# Patient Record
Sex: Female | Born: 1975 | Race: White | Hispanic: No | Marital: Single | Smoking: Never smoker
Health system: Southern US, Community
[De-identification: ages and names within clinical notes are randomized; demographics above are authoritative.]

## PROBLEM LIST (undated history)

## (undated) ENCOUNTER — Emergency Department: Disposition: A | Discharge status: Home / Self Care | Payer: BC Managed Care – PPO

---

## 2007-02-26 ENCOUNTER — Ambulatory Visit: Payer: Self-pay | Admitting: Internal Medicine

## 2007-03-01 ENCOUNTER — Ambulatory Visit: Payer: Self-pay | Admitting: Internal Medicine

## 2008-06-23 ENCOUNTER — Ambulatory Visit: Payer: Self-pay | Admitting: Family Medicine

## 2008-12-10 ENCOUNTER — Ambulatory Visit: Payer: Self-pay | Admitting: Internal Medicine

## 2015-12-12 ENCOUNTER — Ambulatory Visit
Admission: EM | Admit: 2015-12-12 | Discharge: 2015-12-12 | Disposition: A | Discharge status: Home / Self Care | Payer: BLUE CROSS/BLUE SHIELD | Attending: Family Medicine | Admitting: Family Medicine

## 2015-12-12 ENCOUNTER — Encounter: Payer: Self-pay | Admitting: *Deleted

## 2015-12-12 DIAGNOSIS — J208 Acute bronchitis due to other specified organisms: Secondary | ICD-10-CM

## 2015-12-12 MED ORDER — GUAIFENESIN-CODEINE 100-10 MG/5ML PO SOLN: ORAL | Status: AC

## 2015-12-12 NOTE — ED Notes (Signed)
Non-productive cough, and sore throat since Saturday. [facility] and dx with bronchitis and rx Z-pak.

## 2015-12-12 NOTE — ED Provider Notes (Signed)
CSN: [id number]     Arrival date & time 12/12/15  1028 History   First MD Initiated Contact with Patient 12/12/15 1110     Chief Complaint  Patient presents with  . Sore Throat  . Cough   (Consider location/radiation/quality/duration/timing/severity/associated sxs/prior Treatment) Patient is a 40 y.o. female presenting with URI. The history is provided by the patient.  URI Presenting symptoms: cough, rhinorrhea and sore throat   Presenting symptoms: no ear pain, no facial pain and no fever   Severity:  Moderate Onset quality:  Sudden Duration:  6 days Timing:  Constant Progression:  Unchanged Chronicity:  New Relieved by:  Nothing Ineffective treatments:  OTC medications and prescription medications (z pak) Associated symptoms: no headaches, no neck pain, no sinus pain and no wheezing   Risk factors: not elderly, no chronic cardiac disease, no chronic kidney disease, no chronic respiratory disease, no diabetes mellitus, no immunosuppression, no recent illness, no recent travel and no sick contacts     History reviewed. No pertinent past medical history. History reviewed. No pertinent past surgical history. History reviewed. No pertinent family history. Social History  Substance Use Topics  . Smoking status: Never Smoker   . Smokeless tobacco: None  . Alcohol Use: No   OB History    No data available     Review of Systems  Constitutional: Negative for fever.  HENT: Positive for rhinorrhea and sore throat. Negative for ear pain.   Respiratory: Positive for cough. Negative for wheezing.   Musculoskeletal: Negative for neck pain.  Neurological: Negative for headaches.    Allergies  Sulfa antibiotics  Home Medications   Prior to Admission medications   Medication Sig Start Date End Date Taking? Authorizing Provider  azithromycin (ZITHROMAX) 250 MG tablet Take by mouth daily.   Yes Historical Provider, MD  benzonatate (TESSALON) 100 MG capsule Take 100 mg by mouth 3  (three) times daily as needed for cough.   Yes Historical Provider, MD  guaiFENesin-codeine 100-10 MG/5ML syrup 1-2 teaspoons po qhs prn cough 12/12/15   Payton Mccallumrlando Analissa Bayless, MD   Meds Ordered and Administered this Visit  Medications - No data to display  BP 131/95 mmHg  Pulse 128  Temp(Src) 98 F (36.7 C) (Oral)  Resp 20  Ht 5\' 2"  (1.575 m)  Wt 150 lb (68.04 kg)  BMI 27.43 kg/m2  SpO2 98%  LMP 11/28/2015 (Exact Date) No data found.   Physical Exam  Constitutional: She appears well-developed and well-nourished. No distress.  HENT:  Head: Normocephalic and atraumatic.  Right Ear: Tympanic membrane, external ear and ear canal normal.  Left Ear: Tympanic membrane, external ear and ear canal normal.  Nose: No nose lacerations, sinus tenderness, nasal deformity, septal deviation or nasal septal hematoma. No epistaxis.  No foreign bodies.  Mouth/Throat: Uvula is midline, oropharynx is clear and moist and mucous membranes are normal. No oropharyngeal exudate.  Eyes: Conjunctivae and EOM are normal. Pupils are equal, round, and reactive to light. Right eye exhibits no discharge. Left eye exhibits no discharge. No scleral icterus.  Neck: Normal range of motion. Neck supple. No thyromegaly present.  Cardiovascular: Normal rate, regular rhythm and normal heart sounds.   Pulmonary/Chest: Effort normal and breath sounds normal. No respiratory distress. She has no wheezes. She has no rales.  Lymphadenopathy:    She has no cervical adenopathy.  Skin: She is not diaphoretic.  Nursing note and vitals reviewed.   ED Course  Procedures (including critical care time)  Labs Review Labs  Reviewed - No data to display  Imaging Review No results found.   Visual Acuity Review  Right Eye Distance:   Left Eye Distance:   Bilateral Distance:    Right Eye Near:   Left Eye Near:    Bilateral Near:         MDM   1. Viral bronchitis    Discharge Medication List as of 12/12/2015 11:26 AM     START taking these medications   Details  guaiFENesin-codeine 100-10 MG/5ML syrup 1-2 teaspoons po qhs prn cough, Print       1. diagnosis reviewed with patient 2. rx as per orders above; reviewed possible side effects, interactions, risks and benefits  3. Recommend supportive treatment with fluids 4. Follow-up prn if symptoms worsen or don't improve    Payton Mccallumrlando Holly Pring, MD 12/12/15 1504

## 2015-12-12 NOTE — Discharge Instructions (Signed)

## 2018-02-20 ENCOUNTER — Other Ambulatory Visit: Payer: Self-pay | Admitting: Family Medicine

## 2018-02-20 DIAGNOSIS — Z1231 Encounter for screening mammogram for malignant neoplasm of breast: Secondary | ICD-10-CM

## 2018-02-27 ENCOUNTER — Ambulatory Visit
Admission: RE | Admit: 2018-02-27 | Discharge: 2018-02-27 | Disposition: A | Discharge status: Home / Self Care | Payer: BLUE CROSS/BLUE SHIELD | Source: Ambulatory Visit | Attending: Family Medicine | Admitting: Family Medicine

## 2018-02-27 DIAGNOSIS — Z1231 Encounter for screening mammogram for malignant neoplasm of breast: Secondary | ICD-10-CM | POA: Insufficient documentation

## 2018-02-28 ENCOUNTER — Other Ambulatory Visit: Payer: Self-pay | Admitting: Family Medicine

## 2018-02-28 DIAGNOSIS — N6489 Other specified disorders of breast: Secondary | ICD-10-CM

## 2018-02-28 DIAGNOSIS — R928 Other abnormal and inconclusive findings on diagnostic imaging of breast: Secondary | ICD-10-CM

## 2018-03-10 ENCOUNTER — Ambulatory Visit
Admission: RE | Admit: 2018-03-10 | Discharge: 2018-03-10 | Disposition: A | Discharge status: Home / Self Care | Payer: BLUE CROSS/BLUE SHIELD | Source: Ambulatory Visit | Attending: Family Medicine | Admitting: Family Medicine

## 2018-03-10 DIAGNOSIS — N6489 Other specified disorders of breast: Secondary | ICD-10-CM

## 2018-03-10 DIAGNOSIS — R928 Other abnormal and inconclusive findings on diagnostic imaging of breast: Secondary | ICD-10-CM | POA: Diagnosis not present

## 2019-01-31 ENCOUNTER — Other Ambulatory Visit: Payer: Self-pay | Admitting: Family Medicine

## 2019-01-31 DIAGNOSIS — Z1231 Encounter for screening mammogram for malignant neoplasm of breast: Secondary | ICD-10-CM

## 2019-03-01 ENCOUNTER — Ambulatory Visit
Admission: RE | Admit: 2019-03-01 | Discharge: 2019-03-01 | Disposition: A | Discharge status: Home / Self Care | Payer: BC Managed Care – PPO | Source: Ambulatory Visit | Attending: Family Medicine | Admitting: Family Medicine

## 2019-03-01 ENCOUNTER — Encounter (INDEPENDENT_AMBULATORY_CARE_PROVIDER_SITE_OTHER): Payer: Self-pay

## 2019-03-01 ENCOUNTER — Other Ambulatory Visit: Payer: Self-pay

## 2019-03-01 DIAGNOSIS — Z1231 Encounter for screening mammogram for malignant neoplasm of breast: Secondary | ICD-10-CM | POA: Insufficient documentation

## 2020-01-09 ENCOUNTER — Other Ambulatory Visit: Payer: Self-pay | Admitting: Pediatrics

## 2020-01-09 DIAGNOSIS — Z1231 Encounter for screening mammogram for malignant neoplasm of breast: Secondary | ICD-10-CM

## 2020-01-20 IMAGING — MG MM DIGITAL SCREENING BILAT W/ TOMO W/ CAD
6 of 10 series · 6 of 26 positions shown · non-contrast
Comparison: None.

CLINICAL DATA: Screening.

EXAM:
DIGITAL SCREENING BILATERAL MAMMOGRAM WITH TOMO AND CAD

[R CC synth-2D]
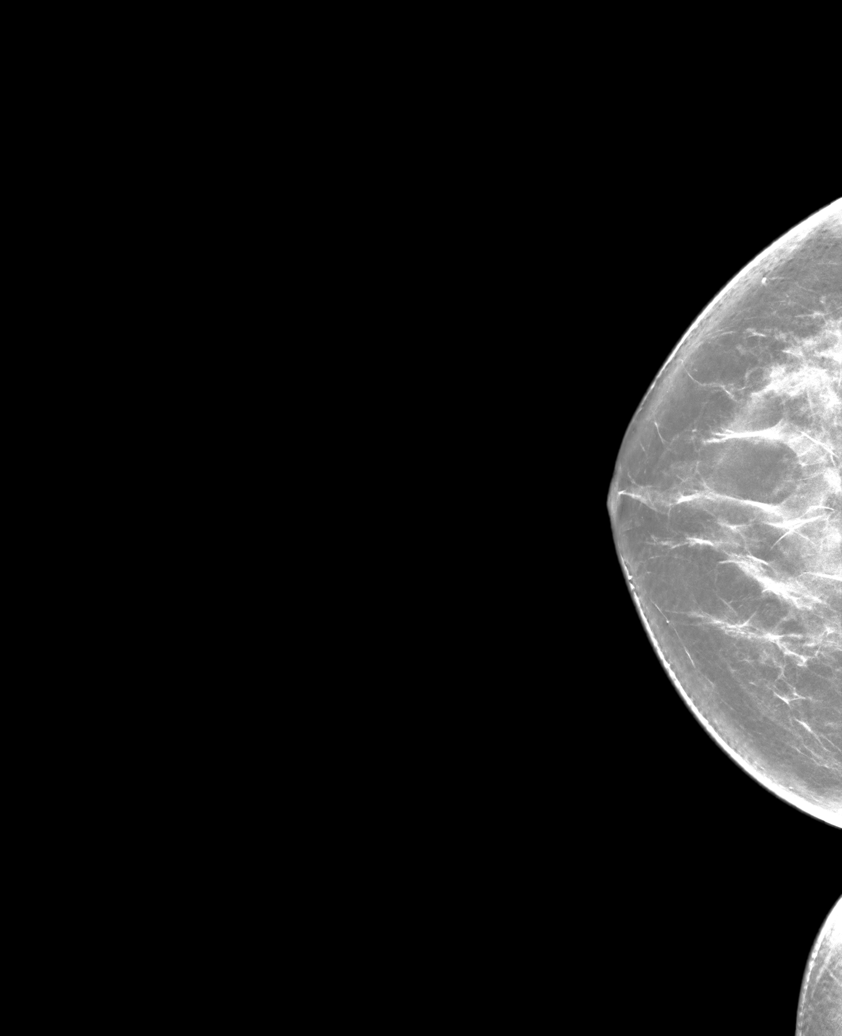

[R MLO synth-2D]
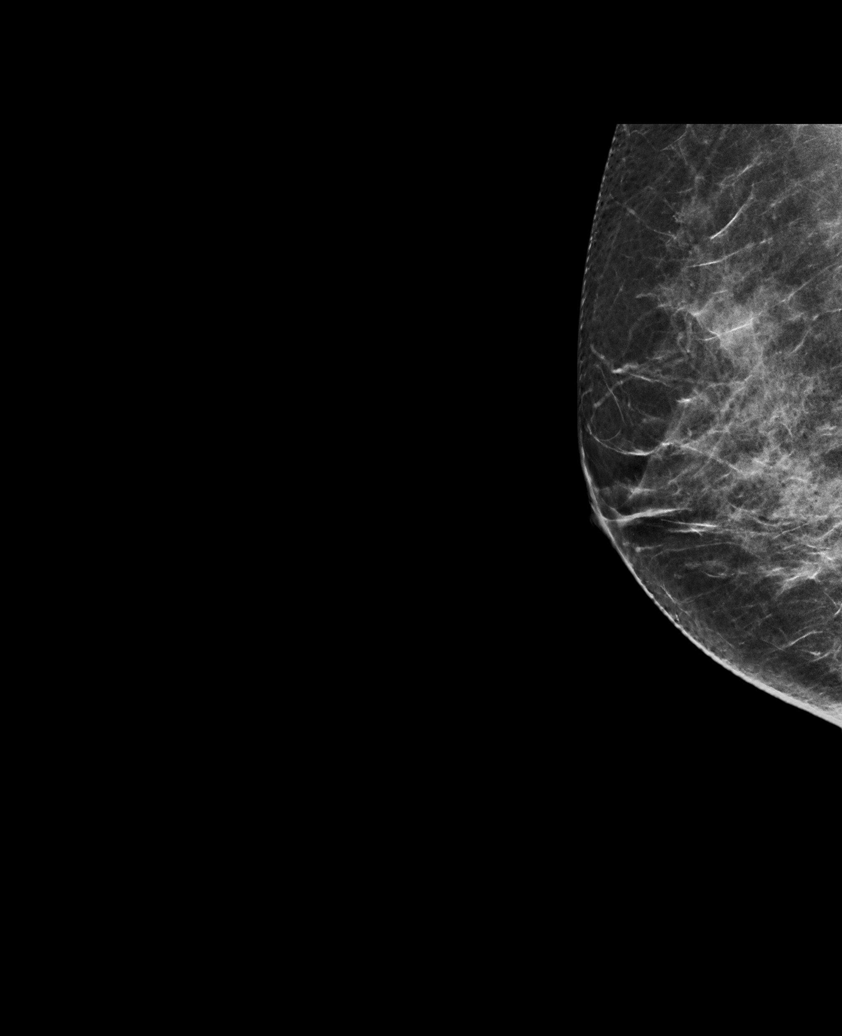

[L MLO synth-2D]
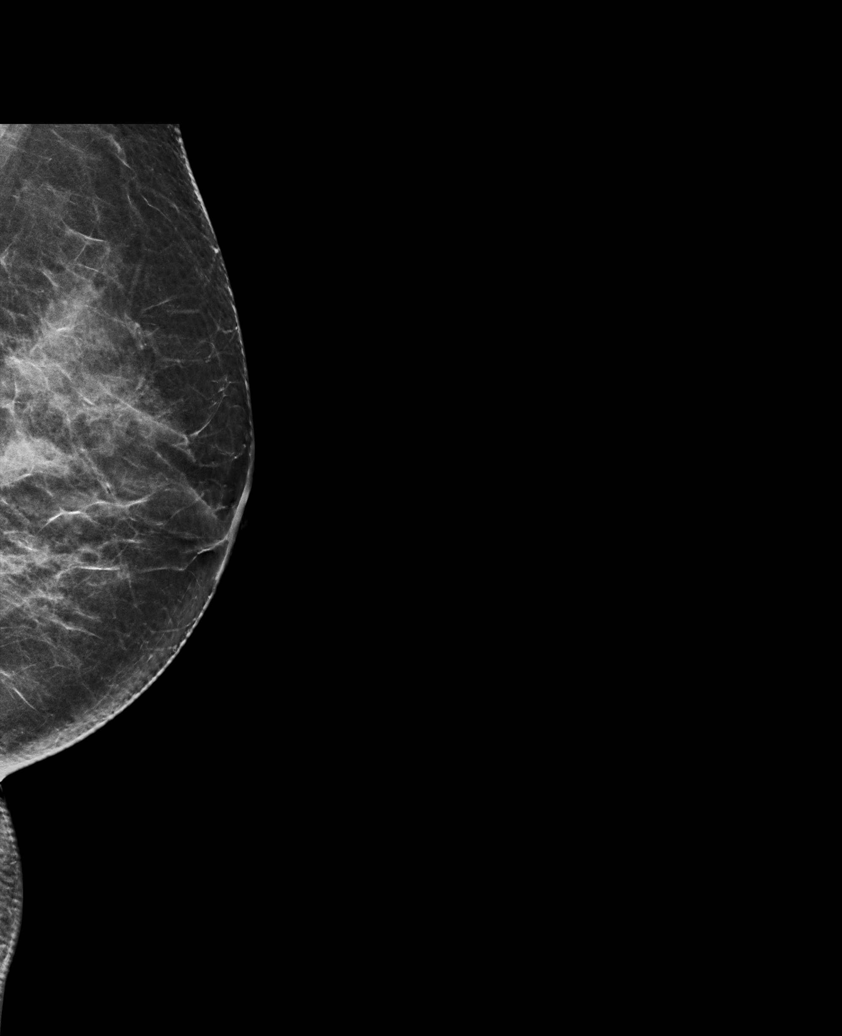

[L CC synth-2D]
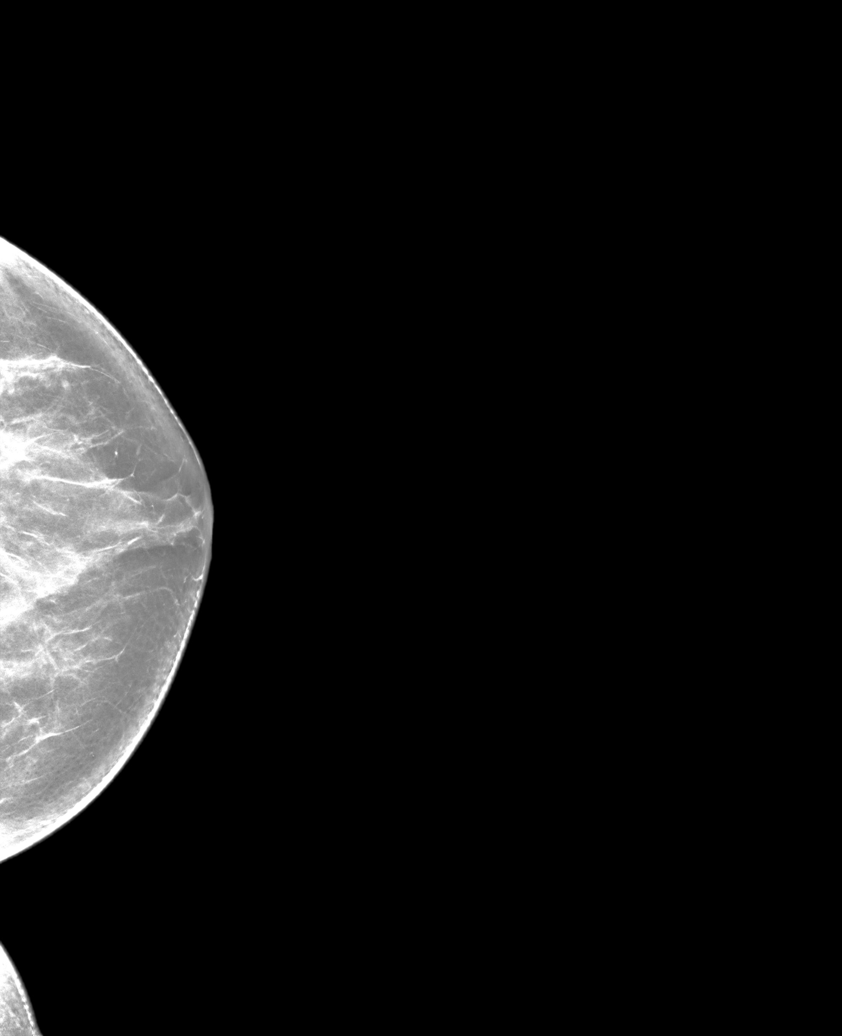

[L MLO (1 of 2)]
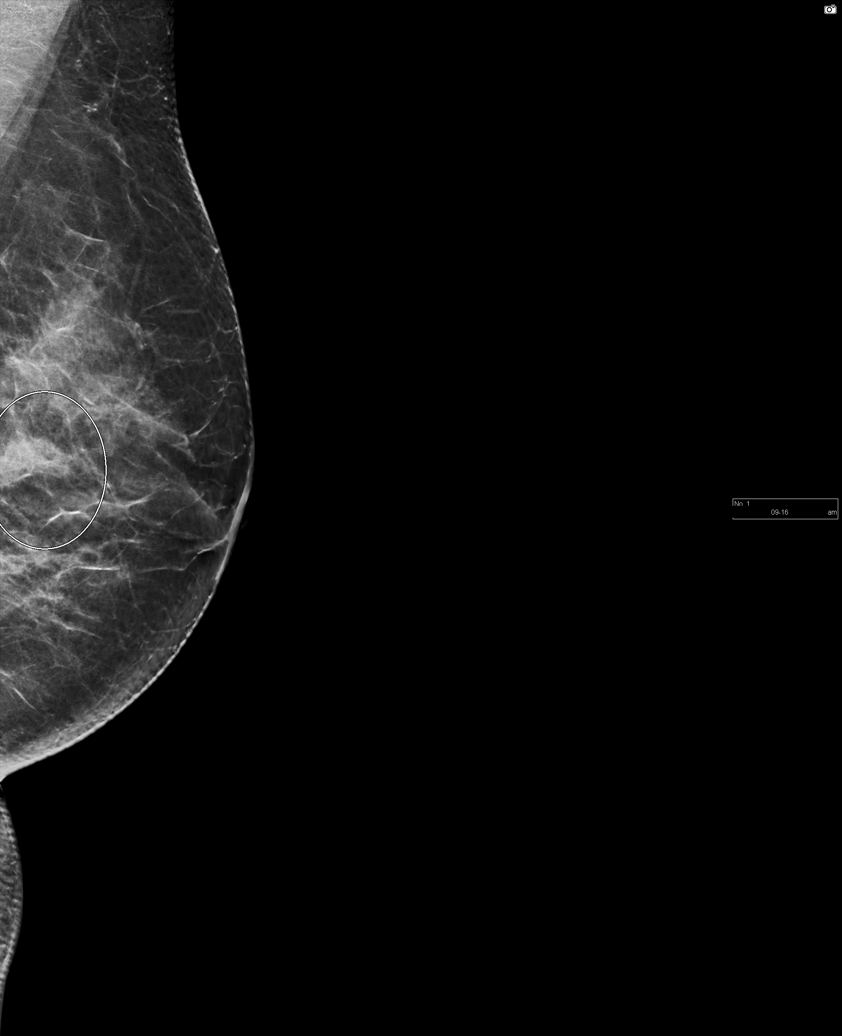

[L MLO (2 of 2)]
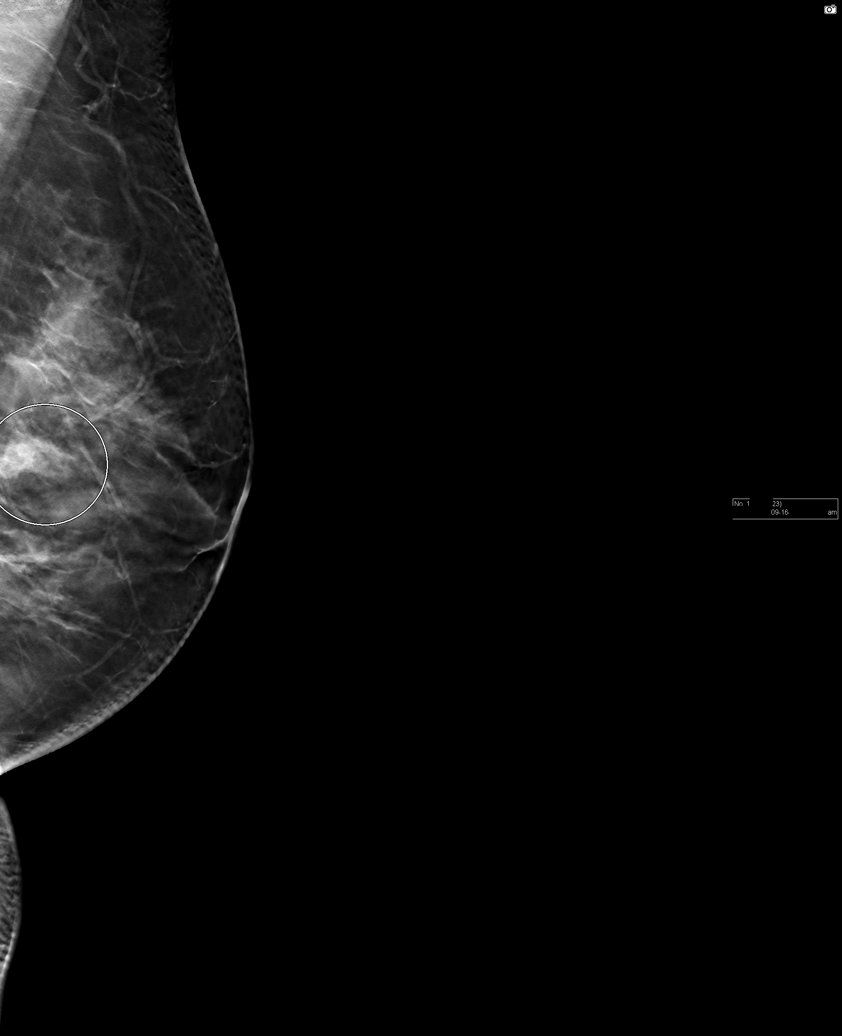

[6 of 26 positions shown; findings below may reference images not displayed]

ACR Breast Density Category c: The breast tissue is heterogeneously
dense, which may obscure small masses.
FINDINGS: In the left breast, a possible asymmetry warrants further
evaluation. In the right breast, no findings suspicious for
malignancy. Images were processed with CAD.
IMPRESSION: Further evaluation is suggested for possible asymmetry in the left
breast.

RECOMMENDATION:
Diagnostic mammogram and possibly ultrasound of the left breast.
(Code:[id number]-[id number])

The patient will be contacted regarding the findings, and additional
imaging will be scheduled.

BI-RADS CATEGORY  0: Incomplete. Need additional imaging evaluation
and/or prior mammograms for comparison.

## 2020-03-05 ENCOUNTER — Other Ambulatory Visit: Payer: Self-pay

## 2020-03-05 ENCOUNTER — Ambulatory Visit
Admission: RE | Admit: 2020-03-05 | Discharge: 2020-03-05 | Disposition: A | Discharge status: Home / Self Care | Payer: BC Managed Care – PPO | Source: Ambulatory Visit | Attending: Pediatrics | Admitting: Pediatrics

## 2020-03-05 DIAGNOSIS — Z1231 Encounter for screening mammogram for malignant neoplasm of breast: Secondary | ICD-10-CM | POA: Insufficient documentation

## 2021-03-09 ENCOUNTER — Other Ambulatory Visit: Payer: Self-pay | Admitting: Family Medicine

## 2021-03-09 DIAGNOSIS — Z1231 Encounter for screening mammogram for malignant neoplasm of breast: Secondary | ICD-10-CM

## 2021-03-26 ENCOUNTER — Ambulatory Visit
Admission: RE | Admit: 2021-03-26 | Discharge: 2021-03-26 | Disposition: A | Discharge status: Home / Self Care | Payer: BC Managed Care – PPO | Source: Ambulatory Visit | Attending: Family Medicine | Admitting: Family Medicine

## 2021-03-26 ENCOUNTER — Other Ambulatory Visit: Payer: Self-pay

## 2021-03-26 DIAGNOSIS — Z1231 Encounter for screening mammogram for malignant neoplasm of breast: Secondary | ICD-10-CM | POA: Insufficient documentation

## 2022-03-19 ENCOUNTER — Other Ambulatory Visit: Payer: Self-pay | Admitting: Physician Assistant

## 2022-03-19 DIAGNOSIS — Z1231 Encounter for screening mammogram for malignant neoplasm of breast: Secondary | ICD-10-CM

## 2022-04-20 ENCOUNTER — Ambulatory Visit
Admission: RE | Admit: 2022-04-20 | Discharge: 2022-04-20 | Disposition: A | Discharge status: Home / Self Care | Payer: BC Managed Care – PPO | Source: Ambulatory Visit | Attending: Physician Assistant | Admitting: Physician Assistant

## 2022-04-20 DIAGNOSIS — Z1231 Encounter for screening mammogram for malignant neoplasm of breast: Secondary | ICD-10-CM | POA: Insufficient documentation

## 2023-04-22 ENCOUNTER — Other Ambulatory Visit: Payer: Self-pay | Admitting: Physician Assistant

## 2023-04-22 DIAGNOSIS — Z1231 Encounter for screening mammogram for malignant neoplasm of breast: Secondary | ICD-10-CM

## 2023-05-19 ENCOUNTER — Ambulatory Visit
Admission: RE | Admit: 2023-05-19 | Discharge: 2023-05-19 | Disposition: A | Discharge status: Home / Self Care | Payer: BC Managed Care – PPO | Source: Ambulatory Visit | Attending: Physician Assistant | Admitting: Physician Assistant

## 2023-05-19 DIAGNOSIS — Z1231 Encounter for screening mammogram for malignant neoplasm of breast: Secondary | ICD-10-CM | POA: Insufficient documentation

## 2023-06-15 ENCOUNTER — Other Ambulatory Visit: Payer: Self-pay | Admitting: Medical Genetics

## 2023-07-16 ENCOUNTER — Other Ambulatory Visit
Admission: RE | Admit: 2023-07-16 | Discharge: 2023-07-16 | Disposition: A | Discharge status: Home / Self Care | Payer: Self-pay | Source: Ambulatory Visit | Attending: Medical Genetics | Admitting: Medical Genetics

## 2023-07-28 LAB — GENECONNECT MOLECULAR SCREEN: Genetic Analysis Overall Interpretation: NEGATIVE

## 2024-05-16 ENCOUNTER — Other Ambulatory Visit: Payer: Self-pay | Admitting: Physician Assistant

## 2024-05-16 DIAGNOSIS — Z1231 Encounter for screening mammogram for malignant neoplasm of breast: Secondary | ICD-10-CM

## 2024-06-25 ENCOUNTER — Ambulatory Visit
Admission: RE | Admit: 2024-06-25 | Discharge: 2024-06-25 | Disposition: A | Discharge status: Home / Self Care | Source: Ambulatory Visit | Attending: Physician Assistant | Admitting: Physician Assistant

## 2024-06-25 DIAGNOSIS — Z1231 Encounter for screening mammogram for malignant neoplasm of breast: Secondary | ICD-10-CM | POA: Diagnosis present
